# Patient Record
Sex: Male | Born: 1970 | Race: White | Hispanic: Yes | Marital: Married | Smoking: Never smoker
Health system: Southern US, Community
[De-identification: ages and names within clinical notes are randomized; demographics above are authoritative.]

## PROBLEM LIST (undated history)

## (undated) HISTORY — PX: HERNIA REPAIR: SHX51

---

## 2006-01-01 ENCOUNTER — Ambulatory Visit (HOSPITAL_BASED_OUTPATIENT_CLINIC_OR_DEPARTMENT_OTHER): Admission: RE | Admit: 2006-01-01 | Discharge: 2006-01-01 | Payer: Self-pay | Admitting: Surgery

## 2011-10-10 ENCOUNTER — Other Ambulatory Visit: Payer: Self-pay | Admitting: Family Medicine

## 2011-10-10 ENCOUNTER — Ambulatory Visit
Admission: RE | Admit: 2011-10-10 | Discharge: 2011-10-10 | Disposition: A | Discharge status: Home / Self Care | Payer: BC Managed Care – PPO | Source: Ambulatory Visit | Attending: Family Medicine | Admitting: Family Medicine

## 2011-10-10 DIAGNOSIS — R52 Pain, unspecified: Secondary | ICD-10-CM

## 2016-08-07 ENCOUNTER — Emergency Department (HOSPITAL_COMMUNITY)
Admission: EM | Admit: 2016-08-07 | Discharge: 2016-08-08 | Disposition: A | Discharge status: Home / Self Care | Payer: BLUE CROSS/BLUE SHIELD | Attending: Emergency Medicine | Admitting: Emergency Medicine

## 2016-08-07 ENCOUNTER — Encounter (HOSPITAL_COMMUNITY): Payer: Self-pay

## 2016-08-07 DIAGNOSIS — M542 Cervicalgia: Secondary | ICD-10-CM | POA: Diagnosis present

## 2016-08-07 DIAGNOSIS — J020 Streptococcal pharyngitis: Secondary | ICD-10-CM | POA: Insufficient documentation

## 2016-08-07 MED ORDER — SODIUM CHLORIDE 0.9 % IV BOLUS (SEPSIS)
1000.0000 mL | Freq: Once | INTRAVENOUS | Status: AC
  Administered 2016-08-08: 1000 mL via INTRAVENOUS

## 2016-08-07 MED ORDER — KETOROLAC TROMETHAMINE 30 MG/ML IJ SOLN
30.0000 mg | Freq: Once | INTRAMUSCULAR | Status: AC
  Administered 2016-08-08: 30 mg via INTRAVENOUS
  Filled 2016-08-07: qty 1

## 2016-08-07 NOTE — ED Triage Notes (Signed)
Pt states that neck pain started Tuesday, denies fevers, pt has limited mobility turning head. Pain is in the back, denies headache

## 2016-08-07 NOTE — ED Provider Notes (Signed)
By signing my name below, I, [name], attest that this documentation has been prepared under the direction and in the presence of Edward MawKristen N Fran Mcree, DO  Electronically Signed: Clovis PuAvnee Patel, ED Scribe. 08/07/16. 12:01 AM.  TIME SEEN: 11:51 PM  CHIEF COMPLAINT:  Chief Complaint  Patient presents with  . Neck Pain    HPI:   Edward Bonilla is a 45 y.o. male with no significant past medical history who presents to the Emergency Department complaining of sudden onset, persistent, posterior neck pain x 3 days, after taking vitamins, which worsened yesterday. He notes his pain intermittently radiates to his right shoulder and is worse with swallowing and extension of his neck. Pt denies any recent injuries, fevers, hx of DM or HIV, numbness/tingling, focal weakness, difficulty swallowing, changes in his voice, any other associated symptoms and modifying factors at this time. No known drug allergies. Denies any injury to the neck. No difficulty ambulating, bowel or bladder incontinence, urinary retention.  ROS: See HPI Constitutional: no fever  Eyes: no drainage  ENT: no runny nose   Cardiovascular:  no chest pain  Resp: no SOB  GI: no vomiting GU: no dysuria Integumentary: no rash  Allergy: no hives  Musculoskeletal: no leg swelling, +neck pain Neurological: no slurred speech ROS otherwise negative  PAST MEDICAL HISTORY/PAST SURGICAL HISTORY:  History reviewed. No pertinent past medical history.  MEDICATIONS:  Prior to Admission medications   Not on File    ALLERGIES:  No Known Allergies  SOCIAL HISTORY:  Social History  Substance Use Topics  . Smoking status: Never Smoker  . Smokeless tobacco: Never Used  . Alcohol use No    FAMILY HISTORY: No family history on file.  EXAM: BP 114/76   Pulse 62   Temp 98.5 F (36.9 C) (Oral)   Resp 20   Ht 5\' 7"  (1.702 m)   Wt 185 lb (83.9 kg)   SpO2 100%   BMI 28.98 kg/m  CONSTITUTIONAL: Alert and oriented and responds  appropriately to questions. Well-appearing; well-nourished, Afebrile and nontoxic HEAD: Normocephalic EYES: Conjunctivae clear, PERRL, EOMI ENT: normal nose; no rhinorrhea; moist mucous membranes; No pharyngeal erythema or petechiae, no tonsillar hypertrophy or exudate, no uvular deviation, no unilateral swelling, no trismus or drooling, no muffled voice, normal phonation, no stridor, no dental caries present, no drainable dental abscess noted, no Ludwig's angina, tongue sits flat in the bottom of the mouth, no angioedema, no facial erythema or warmth, no facial swelling; no pain with movement of the neck NECK: Supple, no meningismus, no nuchal rigidity, no LAD. Trachea midline, no masses or swelling appreciated. No erythema or warmth Unable to reproduce pain with palpation of neck. Pain is reported with extension. Decreased ROM of neck secondary to pain. No midline spinal tenderness, step off or deformity  CARD: RRR; S1 and S2 appreciated; no murmurs, no clicks, no rubs, no gallops RESP: Normal chest excursion without splinting or tachypnea; breath sounds clear and equal bilaterally; no wheezes, no rhonchi, no rales, no hypoxia or respiratory distress, speaking full sentences ABD/GI: Normal bowel sounds; non-distended; soft, non-tender, no rebound, no guarding, no peritoneal signs, no hepatosplenomegaly BACK:  The back appears normal and is non-tender to palpation, there is no CVA tenderness EXT: Normal ROM in all joints; non-tender to palpation; no edema; normal capillary refill; no cyanosis, no calf tenderness or swelling    SKIN: Normal color for age and race; warm; no rash NEURO: Moves all extremities equally, sensation to light touch intact diffusely,  cranial nerves II through XII intact, normal speech, normal gait PSYCH: The patient's mood and manner are appropriate. Grooming and personal hygiene are appropriate.  MEDICAL DECISION MAKING: Patient here with neck pain. Pain seems to be  musculoskeletal in nature as it is more painful when he moves his neck especially with extension but I am unable to reproduce it with palpation. He also reports some pain with swallowing states it started 3 days ago after he took several vitamins. He does not have any difficulty speaking or swallowing his secretions. Has been able to eat and drink but states pain increases with swallowing. Posterior oropharynx appears unremarkable. No sign of peritonsillar abscess, pharyngitis. No neurologic deficits to suggest spinal stenosis, cervical myelopathy, epidural abscess or hematoma, discitis or transverse myelitis. No recent injury to suggest fracture.  He has no fever and is not immunocompromised but differential does include deep space neck infection. We'll proceed with a CT scan of his soft tissues for further vibration. Discussed with radiology technician who states that this will also include his cervical spine bones. Will obtain labs. Will give Toradol for pain and reassess.  ED PROGRESS: Patient's labs are unremarkable. CT scan shows mild mucosal edema involving the left oropharynx suspicious for mild, early acute pharyngitis. No loculated or drainable fluid collection appreciated. Then appear normal. No deep space neck infection. No peritonsillar abscess. No dental abscess. He reports feeling better after Toradol. Strep test is positive. He would like treatment with IM penicillin. Will discharge with prescription for ibuprofen to take for pain at home. Discussed return precautions. He verbalizes understanding and is comfortable with this plan.   At this time, I do not feel there is any life-threatening condition present. I have reviewed and discussed all results (EKG, imaging, lab, urine as appropriate) and exam findings with patient/family. I have reviewed nursing notes and appropriate previous records.  I feel the patient is safe to be discharged home without further emergent workup and can continue workup  as an outpatient as needed. Discussed usual and customary return precautions. Patient/family verbalize understanding and are comfortable with this plan.  Outpatient follow-up has been provided. All questions have been answered.     I personally performed the services described in this documentation, which was scribed in my presence. The recorded information has been reviewed and is accurate.     Edward MawKristen N Melika Reder, DO 08/08/16 0222

## 2016-08-08 ENCOUNTER — Emergency Department (HOSPITAL_COMMUNITY): Payer: BLUE CROSS/BLUE SHIELD

## 2016-08-08 LAB — CBC WITH DIFFERENTIAL/PLATELET
BASOS PCT: 0 %
Basophils Absolute: 0 10*3/uL (ref 0.0–0.1)
Eosinophils Absolute: 0.2 10*3/uL (ref 0.0–0.7)
Eosinophils Relative: 2 %
HEMATOCRIT: 42 % (ref 39.0–52.0)
Hemoglobin: 14.4 g/dL (ref 13.0–17.0)
Lymphocytes Relative: 23 %
Lymphs Abs: 1.7 10*3/uL (ref 0.7–4.0)
MCH: 29.6 pg (ref 26.0–34.0)
MCHC: 34.3 g/dL (ref 30.0–36.0)
MCV: 86.2 fL (ref 78.0–100.0)
MONO ABS: 0.7 10*3/uL (ref 0.1–1.0)
MONOS PCT: 9 %
NEUTROS ABS: 4.9 10*3/uL (ref 1.7–7.7)
Neutrophils Relative %: 66 %
Platelets: 277 10*3/uL (ref 150–400)
RBC: 4.87 MIL/uL (ref 4.22–5.81)
RDW: 14 % (ref 11.5–15.5)
WBC: 7.4 10*3/uL (ref 4.0–10.5)

## 2016-08-08 LAB — BASIC METABOLIC PANEL
Anion gap: 9 (ref 5–15)
BUN: 10 mg/dL (ref 6–20)
CALCIUM: 9.4 mg/dL (ref 8.9–10.3)
CO2: 24 mmol/L (ref 22–32)
CREATININE: 1.09 mg/dL (ref 0.61–1.24)
Chloride: 105 mmol/L (ref 101–111)
GFR calc non Af Amer: >60 mL/min (ref >60)
GLUCOSE: 95 mg/dL (ref 65–99)
Potassium: 3.7 mmol/L (ref 3.5–5.1)
Sodium: 138 mmol/L (ref 135–145)

## 2016-08-08 LAB — RAPID STREP SCREEN (MED CTR MEBANE ONLY): STREPTOCOCCUS, GROUP A SCREEN (DIRECT): POSITIVE — AB

## 2016-08-08 MED ORDER — IOPAMIDOL (ISOVUE-300) INJECTION 61%
INTRAVENOUS | Status: AC
  Administered 2016-08-08: 75 mL
  Filled 2016-08-08: qty 75

## 2016-08-08 MED ORDER — MORPHINE SULFATE (PF) 4 MG/ML IV SOLN: 4.0000 mg | Freq: Once | INTRAVENOUS | Status: DC

## 2016-08-08 MED ORDER — PENICILLIN G BENZATHINE 1200000 UNIT/2ML IM SUSP
1.2000 10*6.[IU] | Freq: Once | INTRAMUSCULAR | Status: AC
  Administered 2016-08-08: 1.2 10*6.[IU] via INTRAMUSCULAR
  Filled 2016-08-08: qty 2

## 2016-08-08 NOTE — Discharge Instructions (Signed)
You may alternate between Tylenol 1000 mg every 6 hours as needed for fever and pain and ibuprofen 800 mg every 8 hours as needed for fever and pain. Your labs today were normal. Your CT scan showed some mild swelling in the left side of your throat and you have had a positive strep test. We have treated you with an injection of penicillin and you do not need to go home on antibiotics. If you develop worsening pain, numbness or weakness in your arms or legs, cannot hold your bowel or bladder, cannot empty your bladder, have a difficult time swallowing or speaking or breathing, please return to the hospital.

## 2016-08-08 NOTE — ED Notes (Signed)
Nurse starting IV 

## 2016-08-08 NOTE — ED Notes (Signed)
Patient Alert and oriented X4. Stable and ambulatory. Patient verbalized understanding of the discharge instructions.  Patient belongings were taken by the patient.  

## 2018-05-17 IMAGING — CT CT NECK W/ CM
4 of 5 series · 15 of 33 positions shown, 17 images · IV contrast (Omni 300)
Comparison: None available.

CLINICAL DATA: Initial evaluation for neck pain and stiffness,
worse with extension and swallowing.

EXAM:
CT NECK WITH CONTRAST
TECHNIQUE: Multidetector CT imaging of the neck was performed using the
standard protocol following the bolus administration of intravenous
contrast.
CONTRAST:  75mL JJXIR1-LNN IOPAMIDOL (JJXIR1-LNN) INJECTION 61%

[Series 2: neck 2.0 st · axial · 0.48mm/px · z∈[-230,-98]mm · 4 of 110 slices shown, 5 images (1 of 3)]
[im 22/110  soft-tissue]
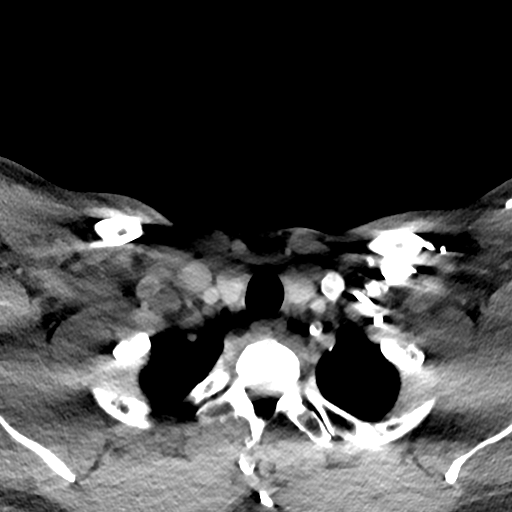
[im 22/110  bone]
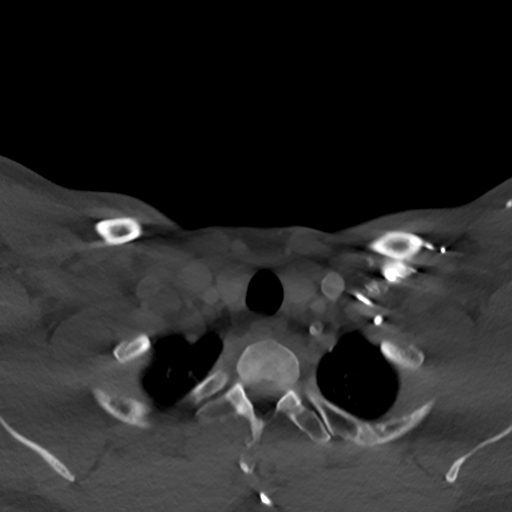
[im 44/110  bone]
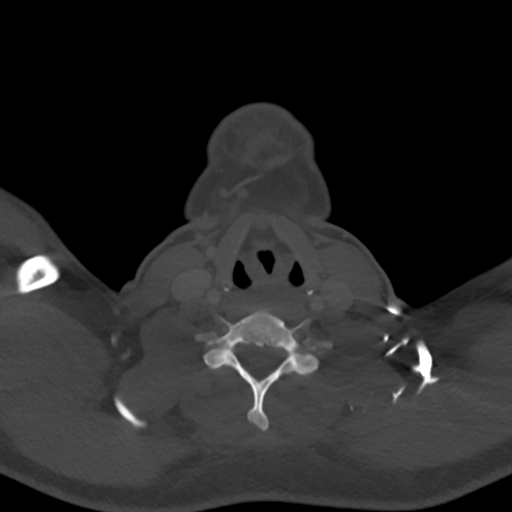
[im 66/110  bone]
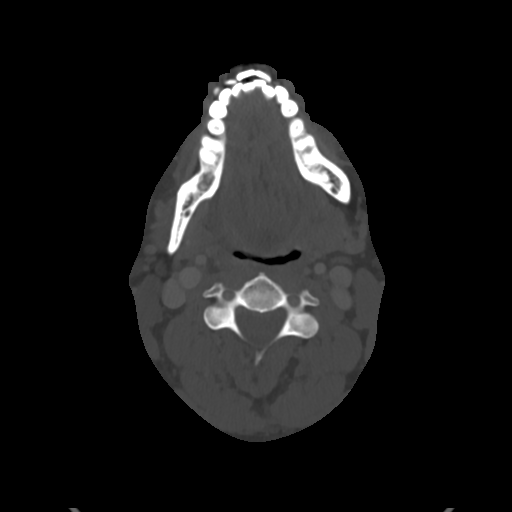
[im 88/110  bone]
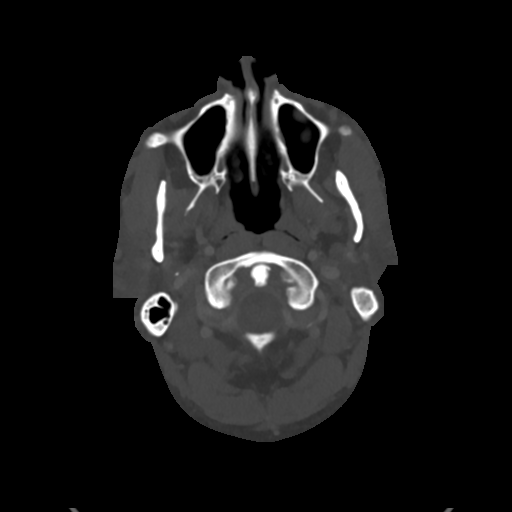

[Series 4: neck 2.0 st · sagittal · 0.43mm/px · 5 of 101 slices shown, 6 images (2 of 3)]
[im 34/101  bone]
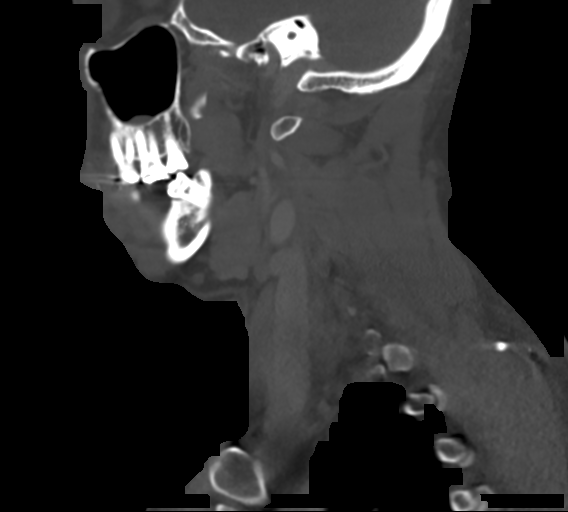
[im 42/101  bone]
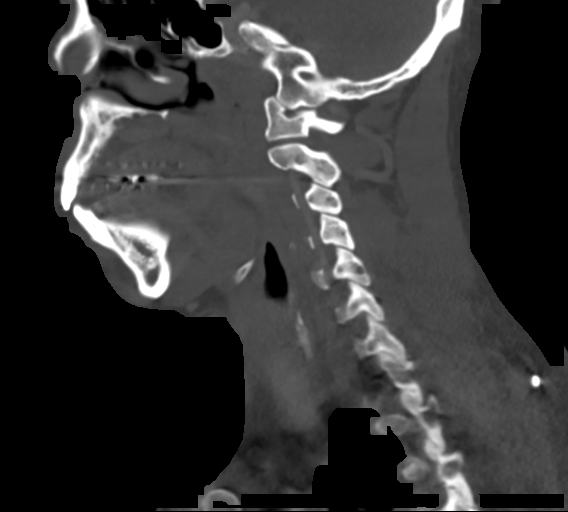
[im 51/101  soft-tissue]
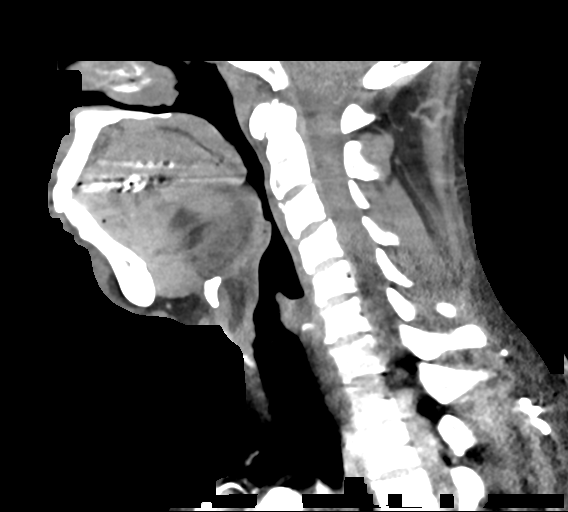
[im 51/101  bone]
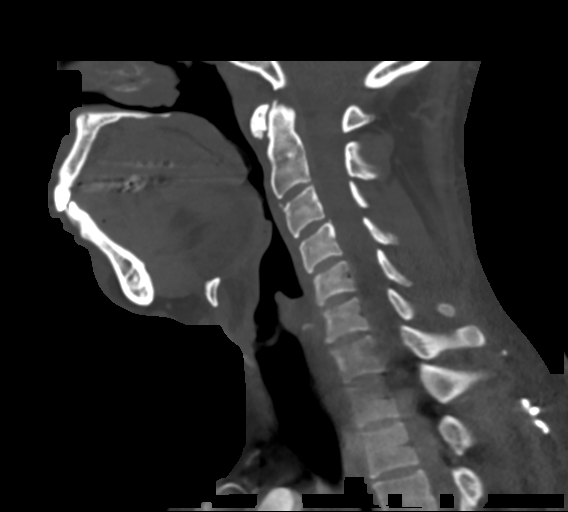
[im 59/101  bone]
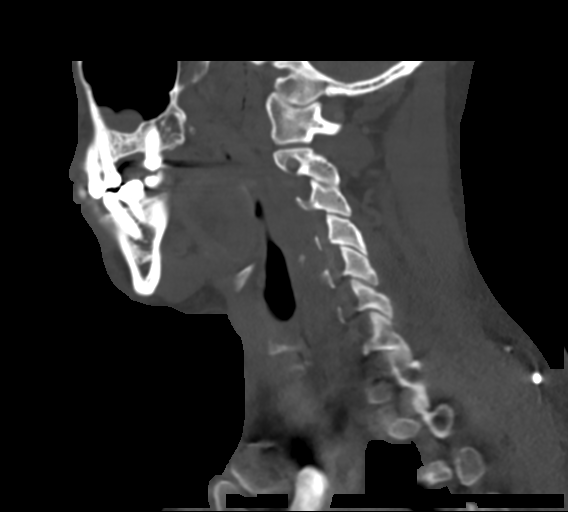
[im 67/101  bone]
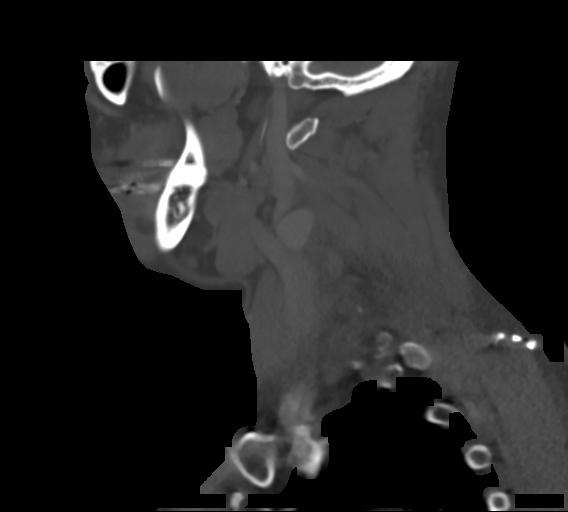

[Series 5: neck 2.0 st · coronal · 0.43mm/px · 3 of 121 slices shown (3 of 3)]
[im 25/121  bone]
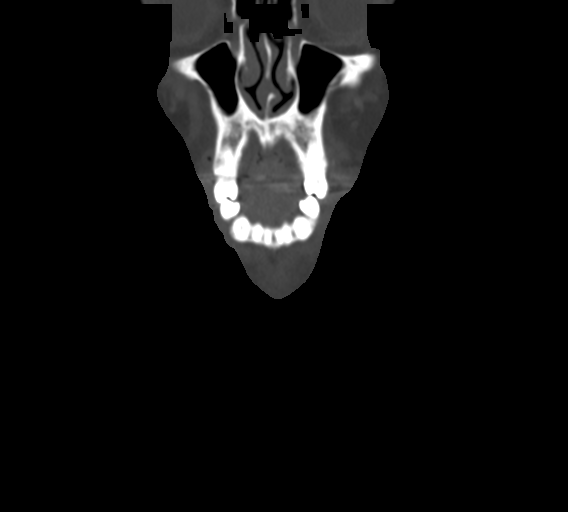
[im 49/121  bone]
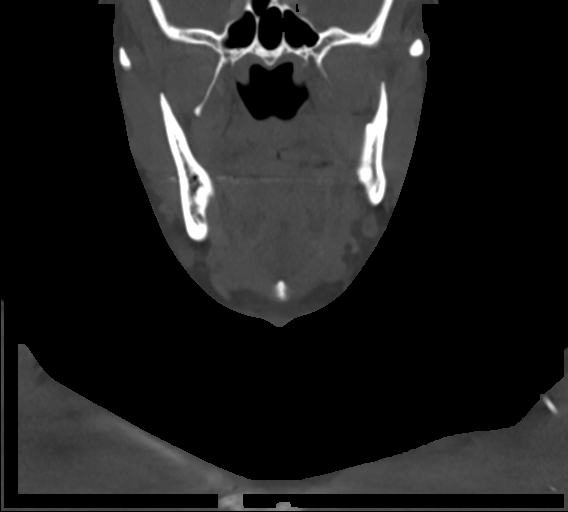
[im 73/121  bone]
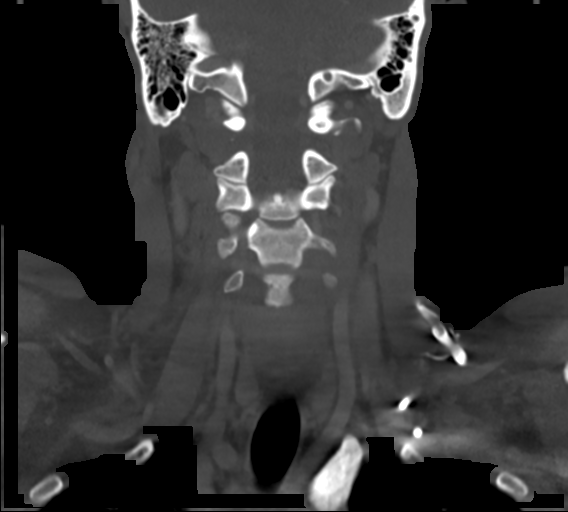

[Series 6: neck 2.0 st orthogonal · axial · 0.39mm/px · z∈[-228,-142]mm · 3 of 109 slices shown]
[im 22/109  bone]
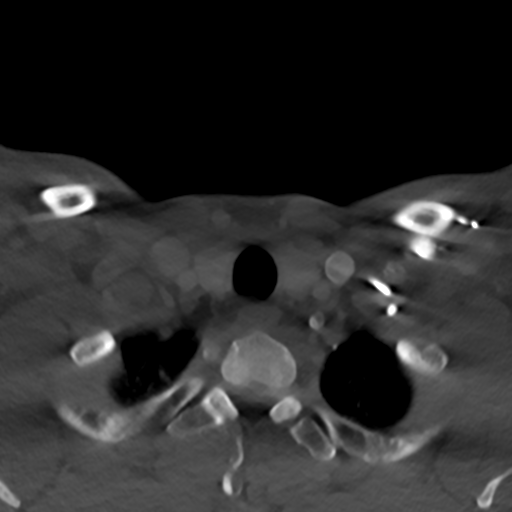
[im 44/109  bone]
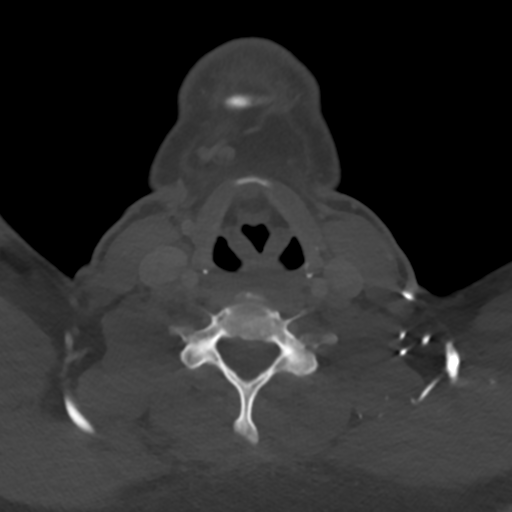
[im 65/109  bone]
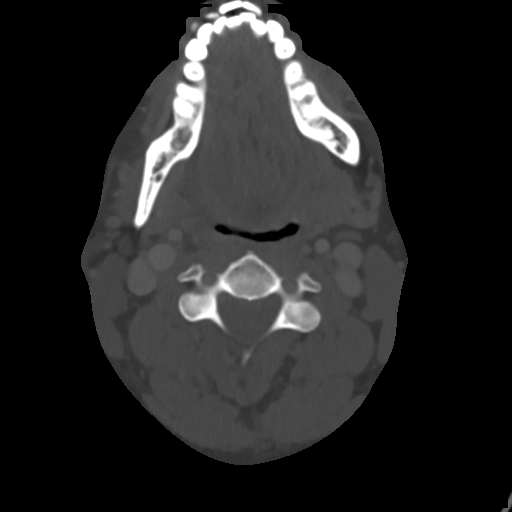

[15 of 33 positions shown; findings below may reference images not displayed]

FINDINGS: Pharynx and larynx: Oral cavity within normal limits without mass
lesion or loculated fluid collection. No acute abnormality about the
dentition. Palatine tonsils symmetric and within normal limits.
Parapharyngeal fat preserved. Nasopharynx within normal limits.
Epiglottis within normal limits. Vallecula largely effaced by the
lingual tonsils. There is subtle mucosal edema within the
oropharynx, most evident within the left pharynx (series 2, image
39), suspicious for acute pharyngitis. No significant
retropharyngeal swelling. No frank retropharyngeal collection or
fusion. Remainder of the hypopharynx and supraglottic larynx within
normal limits. True cords symmetric and within normal limits.
Subglottic airway is clear.

Salivary glands: Salivary glands including the parotid glands and
submandibular glands are within normal limits.

Thyroid: Thyroid within normal limits.

Lymph nodes: No pathologically enlarged lymph nodes identified
within the neck.

Vascular: Normal intravascular enhancement seen throughout the neck.

Limited intracranial: Limited views of the brain are grossly
unremarkable.

Visualized orbits: Visualized globes and orbits within normal
limits.

Mastoids and visualized paranasal sinuses: Mild mucosal thickening
within the inferior maxillary sinuses bilaterally, left slightly
worse than right. Visualized paranasal sinuses are otherwise clear.
Visualize mastoids are clear. Middle ear cavities are clear.

Skeleton: No acute osseous abnormality. No worrisome lytic or
blastic osseous lesions.

Upper chest: Visualized superior mediastinum within normal limits.
Visualized lungs are clear.
IMPRESSION: 1. Mild mucosal edema involving the left oropharynx, suspicious for
mild and/or early acute pharyngitis. No loculated or drainable fluid
collection identified.
2. No other acute abnormality identified within the neck.

## 2021-02-12 ENCOUNTER — Emergency Department (HOSPITAL_BASED_OUTPATIENT_CLINIC_OR_DEPARTMENT_OTHER)
Admission: EM | Admit: 2021-02-12 | Discharge: 2021-02-13 | Disposition: A | Discharge status: Home / Self Care | Payer: Managed Care, Other (non HMO) | Attending: Emergency Medicine | Admitting: Emergency Medicine

## 2021-02-12 ENCOUNTER — Other Ambulatory Visit: Payer: Self-pay

## 2021-02-12 ENCOUNTER — Encounter (HOSPITAL_BASED_OUTPATIENT_CLINIC_OR_DEPARTMENT_OTHER): Payer: Self-pay

## 2021-02-12 DIAGNOSIS — U071 COVID-19: Secondary | ICD-10-CM | POA: Diagnosis not present

## 2021-02-12 DIAGNOSIS — Z20822 Contact with and (suspected) exposure to covid-19: Secondary | ICD-10-CM

## 2021-02-12 DIAGNOSIS — R059 Cough, unspecified: Secondary | ICD-10-CM | POA: Diagnosis present

## 2021-02-12 NOTE — ED Triage Notes (Signed)
Patient here POV from Home for COVID-19 Test.  Patient has been having mild symptoms such as mild cough and fever and took an at-home test that was positive and states he would just like a "Rapid Test" to be sure.

## 2021-02-12 NOTE — ED Provider Notes (Signed)
[facility] EMERGENCY DEPT Provider Note   CSN: [id number] Arrival date & time: 02/12/21  2312     History Chief Complaint  Patient presents with   COVID-19 Testing    Edward Bonilla is a 50 y.o. male.  The history is provided by the patient.  URI Presenting symptoms: congestion, cough, rhinorrhea and sore throat   Presenting symptoms: no fever   Severity:  Moderate Onset quality:  Gradual Duration:  2 days Timing:  Constant Progression:  Unchanged Chronicity:  New Relieved by:  Nothing Worsened by:  Nothing Ineffective treatments:  OTC medications Associated symptoms: no arthralgias   Risk factors: not elderly   Home covid test was positive but needs an official test for work.      History reviewed. No pertinent past medical history.  There are no problems to display for this patient.   Past Surgical History:  Procedure Laterality Date   HERNIA REPAIR         History reviewed. No pertinent family history.  Social History   Tobacco Use   Smoking status: Never   Smokeless tobacco: Never  Substance Use Topics   Alcohol use: No    Home Medications Prior to Admission medications   Medication Sig Start Date End Date Taking? Authorizing Provider  Multiple Vitamins-Minerals (MEGA MULTI MEN PO) Take 3 tablets by mouth 2 (two) times daily.    [provider]    Allergies    Patient has no known allergies.  Review of Systems   Review of Systems  Constitutional:  Negative for fever.  HENT:  Positive for congestion, rhinorrhea and sore throat.   Eyes:  Negative for redness.  Respiratory:  Positive for cough.   Gastrointestinal:  Negative for vomiting.  Genitourinary:  Negative for difficulty urinating.  Musculoskeletal:  Negative for arthralgias and neck stiffness.  Skin:  Negative for rash.  Neurological:  Negative for facial asymmetry.  Psychiatric/Behavioral:  Negative for agitation.   All other systems reviewed and are  negative.  Physical Exam Updated Vital Signs BP (!) 147/86 (BP Location: Right Arm)   Pulse 89   Temp (!) 101.7 F (38.7 C) (Oral)   Resp 16   Ht 5\' 7"  (1.702 m)   Wt 83.9 kg   SpO2 100%   BMI 28.97 kg/m   Physical Exam Vitals and nursing note reviewed.  Constitutional:      General: He is not in acute distress.    Appearance: Normal appearance.  HENT:     Head: Normocephalic and atraumatic.     Nose: Nose normal.  Eyes:     Conjunctiva/sclera: Conjunctivae normal.     Pupils: Pupils are equal, round, and reactive to light.  Cardiovascular:     Rate and Rhythm: Normal rate and regular rhythm.     Pulses: Normal pulses.     Heart sounds: Normal heart sounds.  Pulmonary:     Effort: Pulmonary effort is normal.     Breath sounds: Normal breath sounds.  Abdominal:     General: Abdomen is flat. Bowel sounds are normal.     Palpations: Abdomen is soft.     Tenderness: There is no abdominal tenderness. There is no guarding.  Musculoskeletal:        General: Normal range of motion.     Cervical back: Normal range of motion and neck supple.  Skin:    General: Skin is warm and dry.     Capillary Refill: Capillary refill takes less than 2 seconds.  Neurological:     General: No focal deficit present.     Mental Status: He is alert and oriented to person, place, and time.     Deep Tendon Reflexes: Reflexes normal.  Psychiatric:        Mood and Affect: Mood normal.        Behavior: Behavior normal.    ED Results / Procedures / Treatments   Labs (all labs ordered are listed, but only abnormal results are displayed) Labs Reviewed  SARS CORONAVIRUS 2 (TAT 6-24 HRS)    EKG None  Radiology No results found.  Procedures Procedures   Medications Ordered in ED Medications - No data to display  ED Course  I have reviewed the triage vital signs and the nursing notes.  Pertinent labs & imaging results that were available during my care of the patient were reviewed by  me and considered in my medical decision making (see chart for details).    Symptoms and home test consistent with covid, swab sent.    Edward Bonilla was evaluated in Emergency Department on 02/12/2021 for the symptoms described in the history of present illness. He was evaluated in the context of the global COVID-19 pandemic, which necessitated consideration that the patient might be at risk for infection with the SARS-CoV-2 virus that causes COVID-19. Institutional protocols and algorithms that pertain to the evaluation of patients at risk for COVID-19 are in a state of rapid change based on information released by regulatory bodies including the CDC and federal and state organizations. These policies and algorithms were followed during the patient's care in the ED.  Final Clinical Impression(s) / ED Diagnoses Final diagnoses:  Person under investigation for COVID-19   Return for intractable cough, coughing up blood, fevers > 100.4 unrelieved by medication, shortness of breath, intractable vomiting, chest pain, shortness of breath, weakness, numbness, changes in speech, facial asymmetry, abdominal pain, passing out, Inability to tolerate liquids or food, cough, altered mental status or any concerns. No signs of systemic illness or infection. The patient is nontoxic-appearing on exam and vital signs are within normal limits. I have reviewed the triage vital signs and the nursing notes. Pertinent labs & imaging results that were available during my care of the patient were reviewed by me and considered in my medical decision making (see chart for details). After history, exam, and medical workup I feel the patient has been appropriately medically screened and is safe for discharge home. Pertinent diagnoses were discussed with the patient. Patient was given return precautions.  Rx / DC Orders ED Discharge Orders     None        Iyla Balzarini, MD 02/12/21 2356

## 2021-02-13 LAB — SARS CORONAVIRUS 2 (TAT 6-24 HRS): SARS Coronavirus 2: POSITIVE — AB
